# Patient Record
Sex: Female | Born: 1970
Health system: Southern US, Community
[De-identification: ages and names within clinical notes are randomized; demographics above are authoritative.]

---

## 2002-09-17 HISTORY — PX: AUGMENTATION MAMMAPLASTY: SUR837

## 2016-03-02 ENCOUNTER — Other Ambulatory Visit: Payer: Self-pay | Admitting: Obstetrics and Gynecology

## 2016-03-02 ENCOUNTER — Other Ambulatory Visit (HOSPITAL_COMMUNITY)
Admission: RE | Admit: 2016-03-02 | Discharge: 2016-03-02 | Disposition: A | Discharge status: Home / Self Care | Payer: 59 | Source: Ambulatory Visit | Attending: Obstetrics and Gynecology | Admitting: Obstetrics and Gynecology

## 2016-03-02 DIAGNOSIS — Z01419 Encounter for gynecological examination (general) (routine) without abnormal findings: Secondary | ICD-10-CM | POA: Diagnosis present

## 2016-03-02 DIAGNOSIS — Z1151 Encounter for screening for human papillomavirus (HPV): Secondary | ICD-10-CM | POA: Insufficient documentation

## 2016-03-06 LAB — CYTOLOGY - PAP

## 2016-03-07 ENCOUNTER — Other Ambulatory Visit: Payer: Self-pay | Admitting: Obstetrics and Gynecology

## 2016-03-07 DIAGNOSIS — Z1231 Encounter for screening mammogram for malignant neoplasm of breast: Secondary | ICD-10-CM

## 2016-03-09 ENCOUNTER — Other Ambulatory Visit: Payer: Self-pay | Admitting: Obstetrics and Gynecology

## 2016-03-09 DIAGNOSIS — Z1231 Encounter for screening mammogram for malignant neoplasm of breast: Secondary | ICD-10-CM

## 2016-04-13 ENCOUNTER — Ambulatory Visit
Admission: RE | Admit: 2016-04-13 | Discharge: 2016-04-13 | Disposition: A | Discharge status: Home / Self Care | Payer: 59 | Source: Ambulatory Visit | Attending: Obstetrics and Gynecology | Admitting: Obstetrics and Gynecology

## 2016-04-13 DIAGNOSIS — Z1231 Encounter for screening mammogram for malignant neoplasm of breast: Secondary | ICD-10-CM

## 2016-10-26 DIAGNOSIS — M7711 Lateral epicondylitis, right elbow: Secondary | ICD-10-CM | POA: Diagnosis not present

## 2017-03-01 DIAGNOSIS — Z1283 Encounter for screening for malignant neoplasm of skin: Secondary | ICD-10-CM | POA: Diagnosis not present

## 2017-03-01 DIAGNOSIS — D224 Melanocytic nevi of scalp and neck: Secondary | ICD-10-CM | POA: Diagnosis not present

## 2017-03-01 DIAGNOSIS — T148XXA Other injury of unspecified body region, initial encounter: Secondary | ICD-10-CM | POA: Diagnosis not present

## 2017-03-19 ENCOUNTER — Other Ambulatory Visit: Payer: Self-pay | Admitting: Obstetrics and Gynecology

## 2017-03-19 DIAGNOSIS — Z1231 Encounter for screening mammogram for malignant neoplasm of breast: Secondary | ICD-10-CM

## 2017-03-29 DIAGNOSIS — Z13220 Encounter for screening for lipoid disorders: Secondary | ICD-10-CM | POA: Diagnosis not present

## 2017-03-29 DIAGNOSIS — Z01411 Encounter for gynecological examination (general) (routine) with abnormal findings: Secondary | ICD-10-CM | POA: Diagnosis not present

## 2017-03-29 DIAGNOSIS — Z1329 Encounter for screening for other suspected endocrine disorder: Secondary | ICD-10-CM | POA: Diagnosis not present

## 2017-04-19 ENCOUNTER — Ambulatory Visit
Admission: RE | Admit: 2017-04-19 | Discharge: 2017-04-19 | Disposition: A | Discharge status: Home / Self Care | Payer: 59 | Source: Ambulatory Visit | Attending: Obstetrics and Gynecology | Admitting: Obstetrics and Gynecology

## 2017-04-19 DIAGNOSIS — Z1231 Encounter for screening mammogram for malignant neoplasm of breast: Secondary | ICD-10-CM | POA: Diagnosis not present

## 2017-06-11 DIAGNOSIS — Z23 Encounter for immunization: Secondary | ICD-10-CM | POA: Diagnosis not present

## 2017-08-16 DIAGNOSIS — H5203 Hypermetropia, bilateral: Secondary | ICD-10-CM | POA: Diagnosis not present

## 2017-08-16 DIAGNOSIS — H52222 Regular astigmatism, left eye: Secondary | ICD-10-CM | POA: Diagnosis not present

## 2017-09-02 DIAGNOSIS — H40013 Open angle with borderline findings, low risk, bilateral: Secondary | ICD-10-CM | POA: Diagnosis not present

## 2017-10-29 DIAGNOSIS — R509 Fever, unspecified: Secondary | ICD-10-CM | POA: Diagnosis not present

## 2017-10-29 DIAGNOSIS — J209 Acute bronchitis, unspecified: Secondary | ICD-10-CM | POA: Diagnosis not present

## 2017-10-29 DIAGNOSIS — J069 Acute upper respiratory infection, unspecified: Secondary | ICD-10-CM | POA: Diagnosis not present

## 2017-11-19 DIAGNOSIS — Z13220 Encounter for screening for lipoid disorders: Secondary | ICD-10-CM | POA: Diagnosis not present

## 2017-11-19 DIAGNOSIS — Z5181 Encounter for therapeutic drug level monitoring: Secondary | ICD-10-CM | POA: Diagnosis not present

## 2017-11-19 DIAGNOSIS — Z23 Encounter for immunization: Secondary | ICD-10-CM | POA: Diagnosis not present

## 2017-11-19 DIAGNOSIS — Z0000 Encounter for general adult medical examination without abnormal findings: Secondary | ICD-10-CM | POA: Diagnosis not present

## 2018-03-24 ENCOUNTER — Other Ambulatory Visit: Payer: Self-pay | Admitting: Obstetrics and Gynecology

## 2018-03-24 DIAGNOSIS — Z1231 Encounter for screening mammogram for malignant neoplasm of breast: Secondary | ICD-10-CM

## 2018-04-21 ENCOUNTER — Ambulatory Visit: Payer: 59

## 2018-05-14 ENCOUNTER — Ambulatory Visit
Admission: RE | Admit: 2018-05-14 | Discharge: 2018-05-14 | Disposition: A | Discharge status: Home / Self Care | Payer: 59 | Source: Ambulatory Visit | Attending: Obstetrics and Gynecology | Admitting: Obstetrics and Gynecology

## 2018-05-14 DIAGNOSIS — Z1231 Encounter for screening mammogram for malignant neoplasm of breast: Secondary | ICD-10-CM | POA: Diagnosis not present

## 2018-05-16 DIAGNOSIS — Z01411 Encounter for gynecological examination (general) (routine) with abnormal findings: Secondary | ICD-10-CM | POA: Diagnosis not present

## 2018-06-24 DIAGNOSIS — Z23 Encounter for immunization: Secondary | ICD-10-CM | POA: Diagnosis not present

## 2018-08-21 DIAGNOSIS — H524 Presbyopia: Secondary | ICD-10-CM | POA: Diagnosis not present

## 2019-05-18 ENCOUNTER — Other Ambulatory Visit: Payer: Self-pay | Admitting: Obstetrics and Gynecology

## 2019-05-18 ENCOUNTER — Other Ambulatory Visit (HOSPITAL_COMMUNITY)
Admission: RE | Admit: 2019-05-18 | Discharge: 2019-05-18 | Disposition: A | Discharge status: Home / Self Care | Payer: 59 | Source: Ambulatory Visit | Attending: Obstetrics and Gynecology | Admitting: Obstetrics and Gynecology

## 2019-05-18 DIAGNOSIS — Z124 Encounter for screening for malignant neoplasm of cervix: Secondary | ICD-10-CM | POA: Insufficient documentation

## 2019-05-20 LAB — CYTOLOGY - PAP
Diagnosis: NEGATIVE
HPV: NOT DETECTED

## 2019-05-29 ENCOUNTER — Other Ambulatory Visit: Payer: Self-pay | Admitting: Obstetrics and Gynecology

## 2019-05-29 DIAGNOSIS — Z1231 Encounter for screening mammogram for malignant neoplasm of breast: Secondary | ICD-10-CM

## 2019-06-24 ENCOUNTER — Other Ambulatory Visit: Payer: Self-pay

## 2019-06-24 DIAGNOSIS — Z20822 Contact with and (suspected) exposure to covid-19: Secondary | ICD-10-CM

## 2019-06-26 ENCOUNTER — Telehealth: Payer: Self-pay | Admitting: General Practice

## 2019-06-26 LAB — NOVEL CORONAVIRUS, NAA: SARS-CoV-2, NAA: NOT DETECTED

## 2019-06-26 NOTE — Telephone Encounter (Signed)
Negative COVID results given. Patient results "NOT Detected." Caller expressed understanding. ° °

## 2019-07-17 ENCOUNTER — Other Ambulatory Visit: Payer: Self-pay

## 2019-07-17 ENCOUNTER — Ambulatory Visit
Admission: RE | Admit: 2019-07-17 | Discharge: 2019-07-17 | Disposition: A | Discharge status: Home / Self Care | Payer: 59 | Source: Ambulatory Visit | Attending: Obstetrics and Gynecology | Admitting: Obstetrics and Gynecology

## 2019-07-17 DIAGNOSIS — Z1231 Encounter for screening mammogram for malignant neoplasm of breast: Secondary | ICD-10-CM

## 2020-02-24 ENCOUNTER — Other Ambulatory Visit: Payer: Self-pay | Admitting: Family Medicine

## 2020-02-24 DIAGNOSIS — Z1231 Encounter for screening mammogram for malignant neoplasm of breast: Secondary | ICD-10-CM

## 2020-07-18 ENCOUNTER — Other Ambulatory Visit: Payer: Self-pay | Admitting: Family Medicine

## 2020-07-18 ENCOUNTER — Other Ambulatory Visit: Payer: Self-pay

## 2020-07-18 ENCOUNTER — Other Ambulatory Visit: Payer: Self-pay | Admitting: General Surgery

## 2020-07-18 ENCOUNTER — Ambulatory Visit
Admission: RE | Admit: 2020-07-18 | Discharge: 2020-07-18 | Disposition: A | Discharge status: Home / Self Care | Payer: 59 | Source: Ambulatory Visit | Attending: Family Medicine | Admitting: Family Medicine

## 2020-07-18 DIAGNOSIS — Z1231 Encounter for screening mammogram for malignant neoplasm of breast: Secondary | ICD-10-CM

## 2020-07-19 ENCOUNTER — Other Ambulatory Visit: Payer: Self-pay | Admitting: Family Medicine

## 2020-07-19 ENCOUNTER — Other Ambulatory Visit: Payer: Self-pay | Admitting: Obstetrics and Gynecology

## 2020-07-19 DIAGNOSIS — Z1231 Encounter for screening mammogram for malignant neoplasm of breast: Secondary | ICD-10-CM

## 2021-07-19 ENCOUNTER — Other Ambulatory Visit: Payer: Self-pay

## 2021-07-19 ENCOUNTER — Ambulatory Visit
Admission: RE | Admit: 2021-07-19 | Discharge: 2021-07-19 | Disposition: A | Discharge status: Home / Self Care | Payer: 59 | Source: Ambulatory Visit | Attending: Obstetrics and Gynecology | Admitting: Obstetrics and Gynecology

## 2021-07-19 DIAGNOSIS — Z1231 Encounter for screening mammogram for malignant neoplasm of breast: Secondary | ICD-10-CM

## 2022-03-07 ENCOUNTER — Other Ambulatory Visit: Payer: Self-pay | Admitting: Nurse Practitioner

## 2022-03-07 DIAGNOSIS — Z1231 Encounter for screening mammogram for malignant neoplasm of breast: Secondary | ICD-10-CM

## 2022-07-20 ENCOUNTER — Ambulatory Visit
Admission: RE | Admit: 2022-07-20 | Discharge: 2022-07-20 | Disposition: A | Discharge status: Home / Self Care | Payer: 59 | Source: Ambulatory Visit | Attending: Nurse Practitioner | Admitting: Nurse Practitioner

## 2022-07-20 DIAGNOSIS — Z1231 Encounter for screening mammogram for malignant neoplasm of breast: Secondary | ICD-10-CM

## 2022-08-23 IMAGING — MG DIGITAL SCREENING BREAST BILAT IMPLANT W/ TOMO W/ CAD
9 of 14 series · 9 of 34 positions shown · non-contrast
Comparison: Previous exam(s).

CLINICAL DATA: Screening.

EXAM:
DIGITAL SCREENING BILATERAL MAMMOGRAM WITH IMPLANTS, CAD AND
TOMOSYNTHESIS
TECHNIQUE: Bilateral screening digital craniocaudal and mediolateral oblique
mammograms were obtained. Bilateral screening digital breast
tomosynthesis was performed. The images were evaluated with
computer-aided detection. Standard and/or implant displaced views
were performed.

[L MLO]
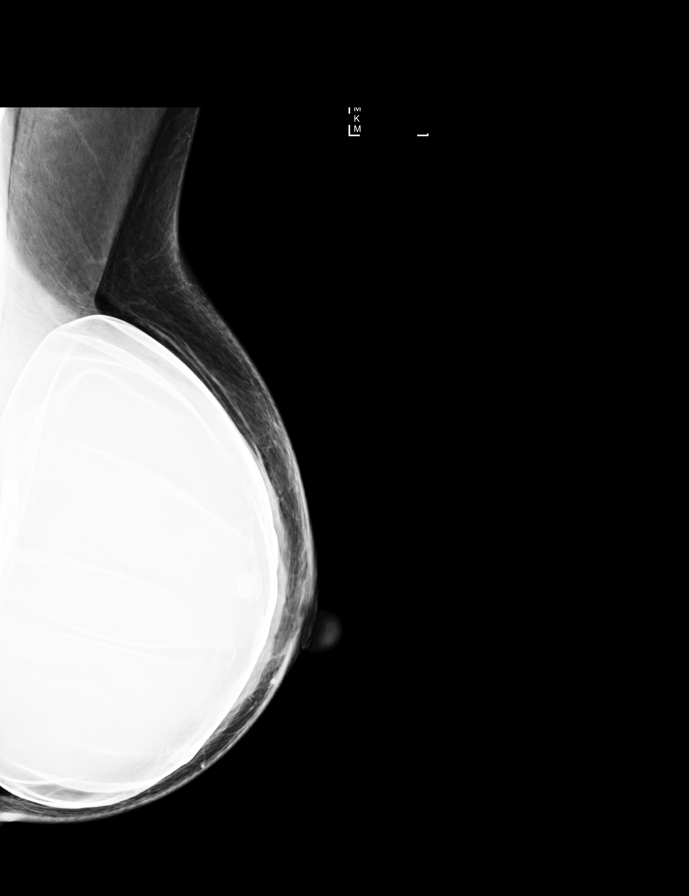

[R CC]
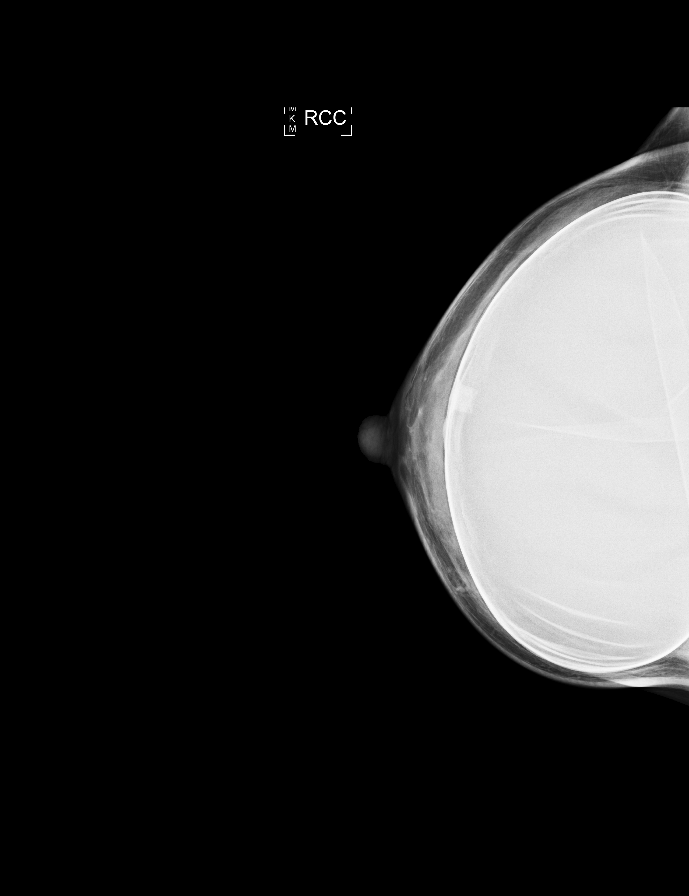

[L CC]
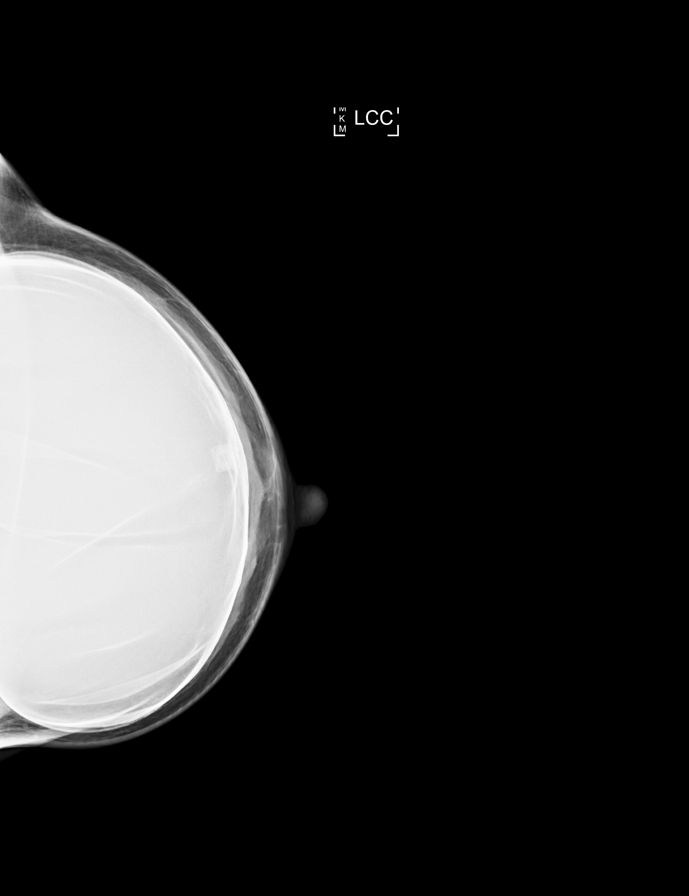

[R MLO]
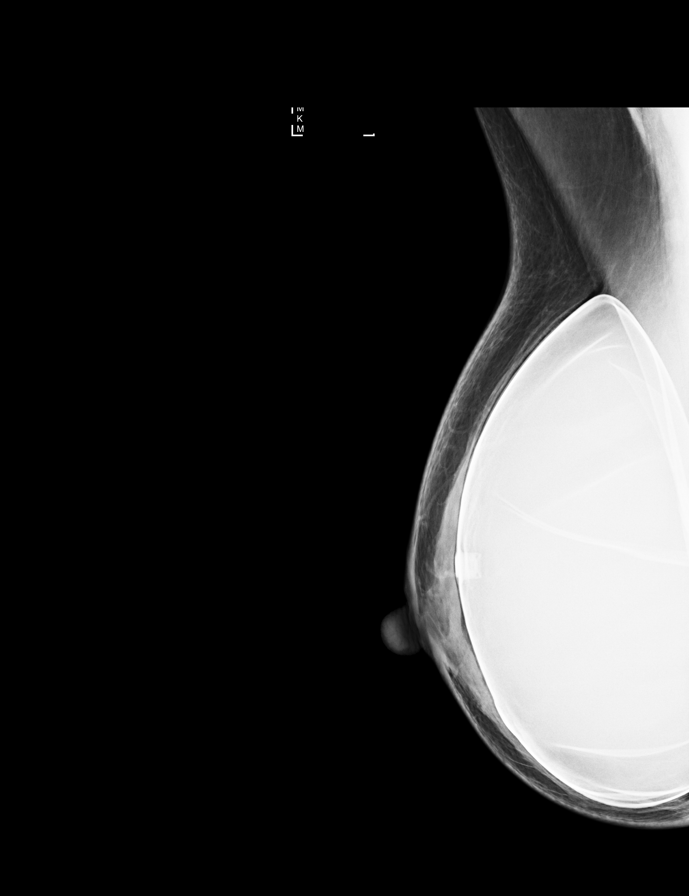

[L CC synth-2D]
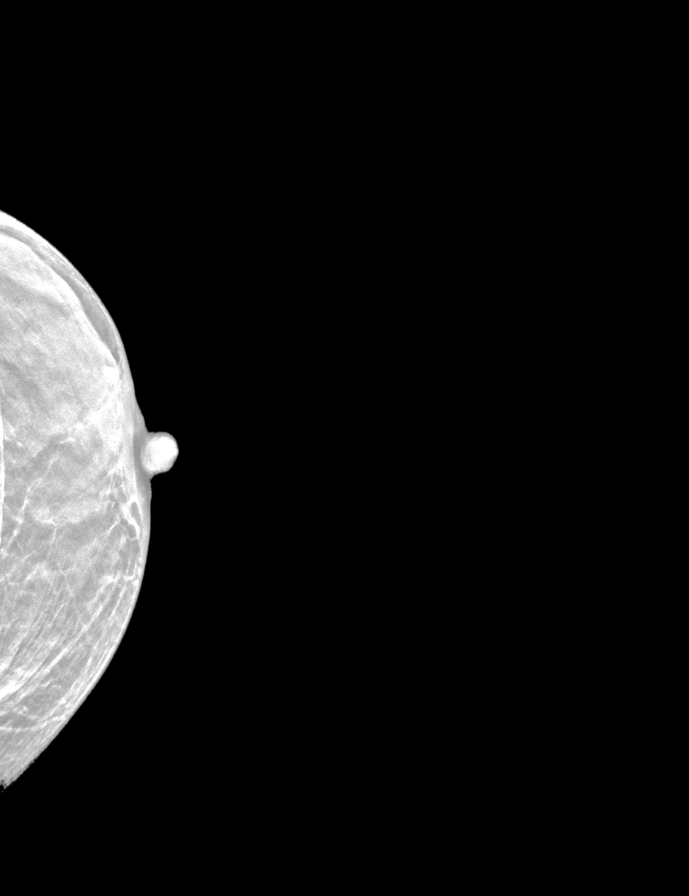

[R MLO synth-2D (1 of 2)]
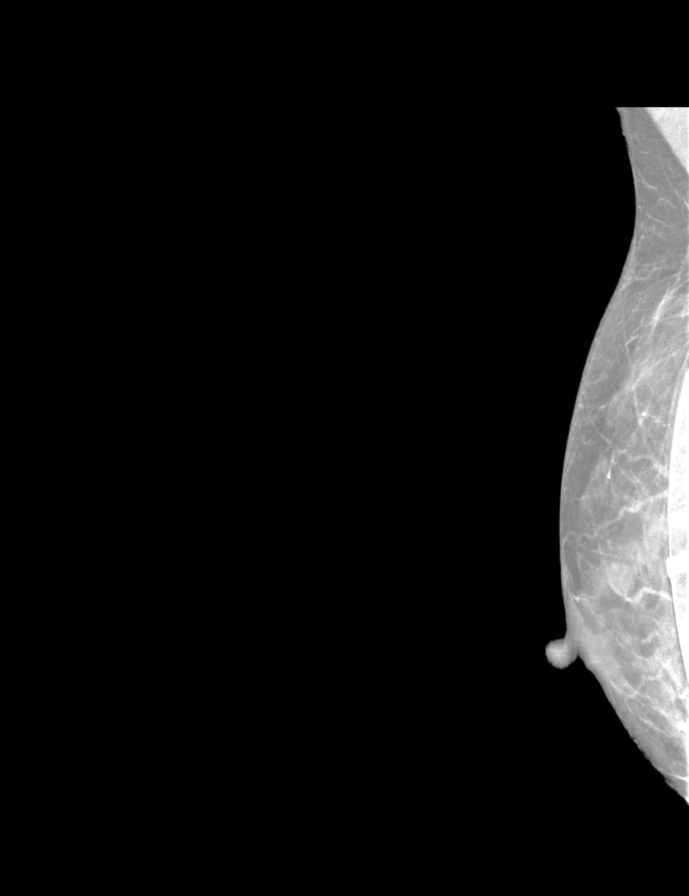

[L MLO synth-2D]
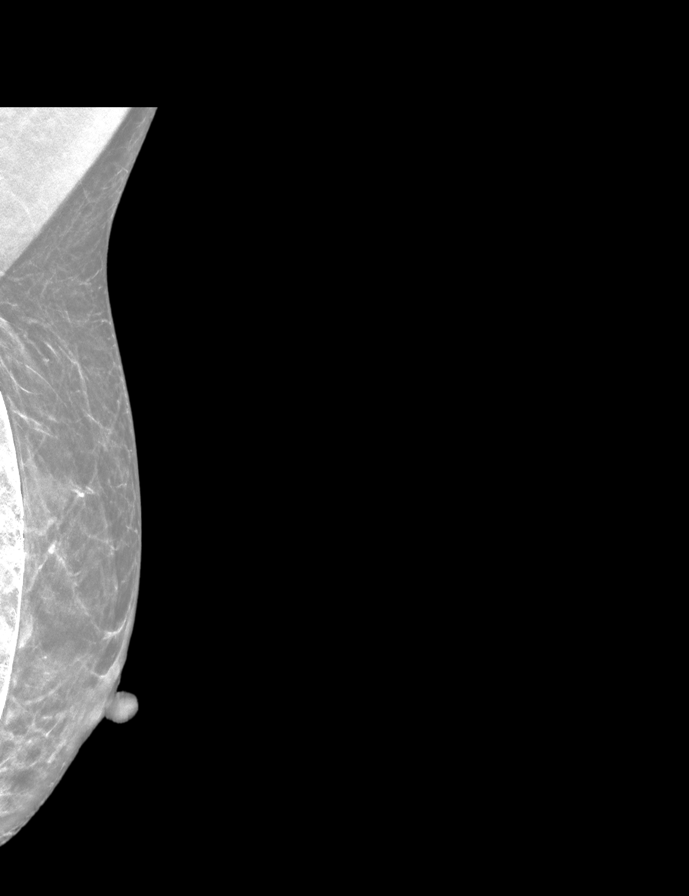

[R CC synth-2D]
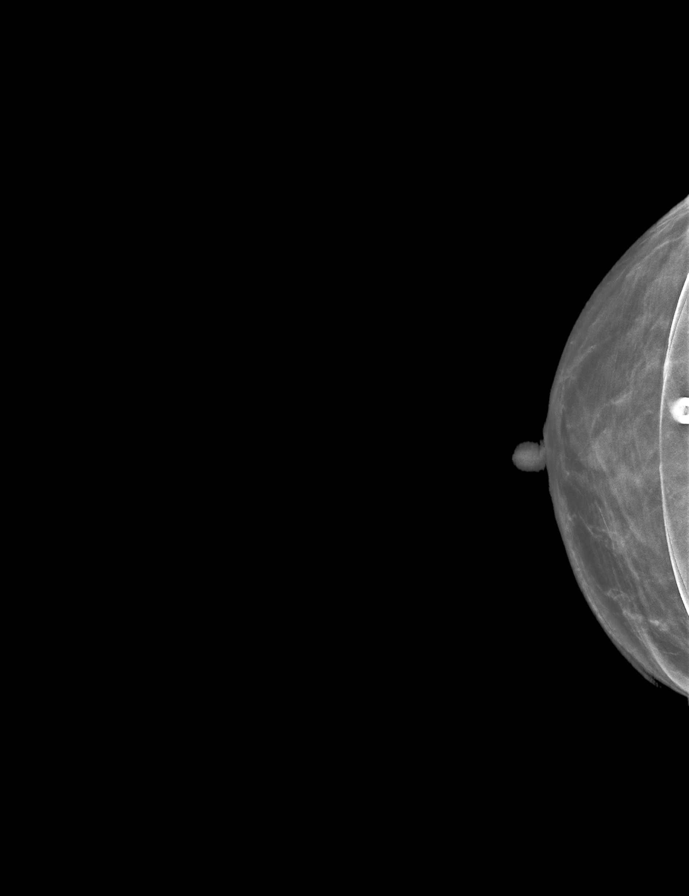

[R MLO synth-2D (2 of 2)]
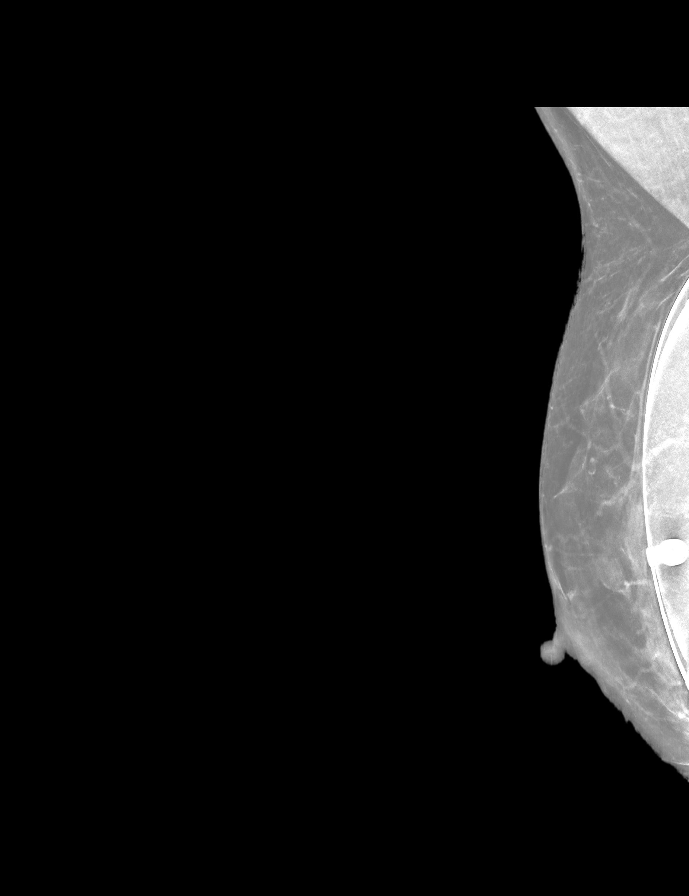

[9 of 34 positions shown; findings below may reference images not displayed]

ACR Breast Density Category c: The breast tissue is heterogeneously
dense, which may obscure small masses.
FINDINGS: The patient has retropectoral implants. There are no findings
suspicious for malignancy.
IMPRESSION: No mammographic evidence of malignancy. A result letter of this
screening mammogram will be mailed directly to the patient.

RECOMMENDATION:
Screening mammogram in one year. (Code:LT-E-7TH)

BI-RADS CATEGORY  1:  Negative.

## 2023-04-19 ENCOUNTER — Other Ambulatory Visit: Payer: Self-pay | Admitting: Nurse Practitioner

## 2023-04-19 DIAGNOSIS — Z1231 Encounter for screening mammogram for malignant neoplasm of breast: Secondary | ICD-10-CM

## 2023-07-22 ENCOUNTER — Ambulatory Visit
Admission: RE | Admit: 2023-07-22 | Discharge: 2023-07-22 | Disposition: A | Discharge status: Home / Self Care | Payer: 59 | Source: Ambulatory Visit | Attending: Nurse Practitioner | Admitting: Nurse Practitioner

## 2023-07-22 DIAGNOSIS — Z1231 Encounter for screening mammogram for malignant neoplasm of breast: Secondary | ICD-10-CM

## 2023-12-27 ENCOUNTER — Other Ambulatory Visit: Payer: Self-pay | Admitting: Nurse Practitioner

## 2023-12-27 DIAGNOSIS — Z1231 Encounter for screening mammogram for malignant neoplasm of breast: Secondary | ICD-10-CM

## 2024-07-24 ENCOUNTER — Ambulatory Visit
Admission: RE | Admit: 2024-07-24 | Discharge: 2024-07-24 | Disposition: A | Discharge status: Home / Self Care | Source: Ambulatory Visit | Attending: Nurse Practitioner | Admitting: Nurse Practitioner

## 2024-07-24 ENCOUNTER — Ambulatory Visit

## 2024-07-24 DIAGNOSIS — Z1231 Encounter for screening mammogram for malignant neoplasm of breast: Secondary | ICD-10-CM

## 2024-09-02 ENCOUNTER — Other Ambulatory Visit: Payer: Self-pay | Admitting: Nurse Practitioner

## 2024-09-02 DIAGNOSIS — Z1231 Encounter for screening mammogram for malignant neoplasm of breast: Secondary | ICD-10-CM

## 2025-07-30 ENCOUNTER — Ambulatory Visit
